# Patient Record
Sex: Female | Born: 1985 | Race: White | Hispanic: No | Marital: Single | Smoking: Never smoker
Health system: Southern US, Community
[De-identification: ages and names within clinical notes are randomized; demographics above are authoritative.]

---
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
## 2005-02-27 ENCOUNTER — Emergency Department: Payer: Self-pay | Admitting: Emergency Medicine

## 2005-03-30 ENCOUNTER — Emergency Department: Payer: Self-pay | Admitting: Emergency Medicine

## 2005-12-07 ENCOUNTER — Emergency Department: Payer: Self-pay | Admitting: Emergency Medicine

## 2005-12-12 ENCOUNTER — Emergency Department: Payer: Self-pay | Admitting: Emergency Medicine

## 2007-05-12 ENCOUNTER — Emergency Department: Payer: Self-pay | Admitting: Unknown Physician Specialty

## 2008-01-31 ENCOUNTER — Emergency Department: Payer: Self-pay | Admitting: Emergency Medicine

## 2008-03-25 ENCOUNTER — Other Ambulatory Visit: Payer: Self-pay

## 2008-03-25 ENCOUNTER — Emergency Department: Payer: Self-pay | Admitting: Emergency Medicine

## 2008-03-28 ENCOUNTER — Emergency Department: Payer: Self-pay | Admitting: Emergency Medicine

## 2008-06-27 ENCOUNTER — Emergency Department: Payer: Self-pay | Admitting: Emergency Medicine

## 2012-01-20 ENCOUNTER — Emergency Department: Payer: Self-pay | Admitting: *Deleted

## 2012-01-20 LAB — URINALYSIS, COMPLETE
Blood: NEGATIVE
Leukocyte Esterase: NEGATIVE
Nitrite: NEGATIVE
Protein: NEGATIVE
RBC,UR: 2 /HPF (ref 0–5)
Specific Gravity: 1.034 (ref 1.003–1.030)
Squamous Epithelial: 8
WBC UR: 2 /HPF (ref 0–5)

## 2012-01-20 LAB — PREGNANCY, URINE: Pregnancy Test, Urine: NEGATIVE m[IU]/mL

## 2012-01-20 LAB — WET PREP, GENITAL

## 2012-03-08 ENCOUNTER — Emergency Department: Payer: Self-pay | Admitting: Emergency Medicine

## 2012-03-08 LAB — COMPREHENSIVE METABOLIC PANEL
Albumin: 3.9 g/dL (ref 3.4–5.0)
Alkaline Phosphatase: 69 U/L (ref 50–136)
Bilirubin,Total: 0.6 mg/dL (ref 0.2–1.0)
Chloride: 109 mmol/L — ABNORMAL HIGH (ref 98–107)
EGFR (African American): >60
Osmolality: 280 (ref 275–301)
SGOT(AST): 25 U/L (ref 15–37)
Total Protein: 7.3 g/dL (ref 6.4–8.2)

## 2012-03-08 LAB — CBC
HGB: 13 g/dL (ref 12.0–16.0)
MCH: 33.5 pg (ref 26.0–34.0)
MCHC: 35.1 g/dL (ref 32.0–36.0)
MCV: 95 fL (ref 80–100)
Platelet: 326 10*3/uL (ref 150–440)
RBC: 3.87 10*6/uL (ref 3.80–5.20)
RDW: 12.3 % (ref 11.5–14.5)

## 2012-03-08 LAB — TROPONIN I: Troponin-I: <0.02 ng/mL

## 2012-05-22 ENCOUNTER — Emergency Department: Payer: Self-pay | Admitting: Emergency Medicine

## 2012-08-14 ENCOUNTER — Emergency Department: Payer: Self-pay | Admitting: Emergency Medicine

## 2012-08-14 LAB — COMPREHENSIVE METABOLIC PANEL
Albumin: 3.5 g/dL (ref 3.4–5.0)
Alkaline Phosphatase: 59 U/L (ref 50–136)
Bilirubin,Total: 0.6 mg/dL (ref 0.2–1.0)
Glucose: 87 mg/dL (ref 65–99)
Osmolality: 275 (ref 275–301)
Potassium: 4.3 mmol/L (ref 3.5–5.1)

## 2012-08-14 LAB — URINALYSIS, COMPLETE
Bacteria: NONE SEEN
Bilirubin,UR: NEGATIVE
Blood: NEGATIVE
Glucose,UR: NEGATIVE mg/dL (ref 0–75)
Ketone: NEGATIVE
Leukocyte Esterase: NEGATIVE
Specific Gravity: 1.018 (ref 1.003–1.030)
Squamous Epithelial: 6

## 2012-08-14 LAB — CBC
HCT: 37.6 % (ref 35.0–47.0)
HGB: 12.8 g/dL (ref 12.0–16.0)
MCH: 32.1 pg (ref 26.0–34.0)
MCHC: 34.1 g/dL (ref 32.0–36.0)
Platelet: 344 10*3/uL (ref 150–440)
RBC: 3.99 10*6/uL (ref 3.80–5.20)
WBC: 5.3 10*3/uL (ref 3.6–11.0)

## 2012-08-14 LAB — WET PREP, GENITAL

## 2012-08-14 LAB — LIPASE, BLOOD: Lipase: 112 U/L (ref 73–393)

## 2012-08-18 ENCOUNTER — Inpatient Hospital Stay: Payer: Self-pay | Admitting: Psychiatry

## 2012-08-18 LAB — COMPREHENSIVE METABOLIC PANEL
Bilirubin,Total: 0.5 mg/dL (ref 0.2–1.0)
Chloride: 106 mmol/L (ref 98–107)
Creatinine: 0.71 mg/dL (ref 0.60–1.30)
EGFR (African American): >60
Glucose: 70 mg/dL (ref 65–99)
Osmolality: 271 (ref 275–301)
Potassium: 4.7 mmol/L (ref 3.5–5.1)
SGOT(AST): 28 U/L (ref 15–37)
Sodium: 137 mmol/L (ref 136–145)

## 2012-08-18 LAB — CBC
HGB: 13.2 g/dL (ref 12.0–16.0)
MCH: 31.8 pg (ref 26.0–34.0)
MCHC: 34.3 g/dL (ref 32.0–36.0)
Platelet: 335 10*3/uL (ref 150–440)
WBC: 6.8 10*3/uL (ref 3.6–11.0)

## 2012-08-18 LAB — ACETAMINOPHEN LEVEL
Acetaminophen: 41 ug/mL — ABNORMAL HIGH
Acetaminophen: 74 ug/mL — ABNORMAL HIGH

## 2012-08-18 LAB — SALICYLATE LEVEL: Salicylates, Serum: <1.7 mg/dL

## 2012-08-18 LAB — DRUG SCREEN, URINE
Amphetamines, Ur Screen: NEGATIVE (ref ?–1000)
Barbiturates, Ur Screen: NEGATIVE (ref ?–200)
Benzodiazepine, Ur Scrn: NEGATIVE (ref ?–200)
Cocaine Metabolite,Ur ~~LOC~~: NEGATIVE (ref ?–300)
MDMA (Ecstasy)Ur Screen: NEGATIVE (ref ?–500)
Opiate, Ur Screen: NEGATIVE (ref ?–300)
Phencyclidine (PCP) Ur S: NEGATIVE (ref ?–25)

## 2012-08-18 LAB — TSH: Thyroid Stimulating Horm: 0.79 u[IU]/mL

## 2012-10-08 ENCOUNTER — Emergency Department: Payer: Self-pay | Admitting: Emergency Medicine

## 2012-10-08 LAB — URINALYSIS, COMPLETE
Bacteria: NONE SEEN
Bilirubin,UR: NEGATIVE
Glucose,UR: NEGATIVE mg/dL (ref 0–75)
Ketone: NEGATIVE
Leukocyte Esterase: NEGATIVE
Ph: 6 (ref 4.5–8.0)
RBC,UR: 8 /HPF (ref 0–5)
Specific Gravity: 1.018 (ref 1.003–1.030)
Squamous Epithelial: 3
WBC UR: 2 /HPF (ref 0–5)

## 2012-10-08 LAB — CBC
HCT: 37.4 % (ref 35.0–47.0)
MCH: 31.4 pg (ref 26.0–34.0)
MCV: 92 fL (ref 80–100)
Platelet: 392 10*3/uL (ref 150–440)
RBC: 4.07 10*6/uL (ref 3.80–5.20)
RDW: 12.6 % (ref 11.5–14.5)

## 2012-10-08 LAB — BASIC METABOLIC PANEL
Anion Gap: 6 — ABNORMAL LOW (ref 7–16)
BUN: 8 mg/dL (ref 7–18)
Chloride: 108 mmol/L — ABNORMAL HIGH (ref 98–107)
Co2: 26 mmol/L (ref 21–32)
EGFR (African American): >60
Glucose: 87 mg/dL (ref 65–99)

## 2012-11-25 ENCOUNTER — Emergency Department: Payer: Self-pay | Admitting: Emergency Medicine

## 2012-11-25 LAB — URINALYSIS, COMPLETE
Bacteria: NONE SEEN
Ketone: NEGATIVE
Leukocyte Esterase: NEGATIVE
Nitrite: NEGATIVE
Ph: 7 (ref 4.5–8.0)
RBC,UR: <1 /HPF (ref 0–5)
Specific Gravity: 1.028 (ref 1.003–1.030)
Squamous Epithelial: 3

## 2013-03-05 ENCOUNTER — Emergency Department: Payer: Self-pay | Admitting: Emergency Medicine

## 2013-03-05 LAB — URINALYSIS, COMPLETE
Bilirubin,UR: NEGATIVE
Blood: NEGATIVE
Glucose,UR: NEGATIVE mg/dL (ref 0–75)
Leukocyte Esterase: NEGATIVE
Nitrite: NEGATIVE
Ph: 8 (ref 4.5–8.0)
Specific Gravity: 1.026 (ref 1.003–1.030)
Squamous Epithelial: 1
WBC UR: 1 /HPF (ref 0–5)

## 2013-03-05 LAB — COMPREHENSIVE METABOLIC PANEL
Albumin: 3.5 g/dL (ref 3.4–5.0)
Anion Gap: 3 — ABNORMAL LOW (ref 7–16)
BUN: 11 mg/dL (ref 7–18)
Bilirubin,Total: 0.4 mg/dL (ref 0.2–1.0)
Calcium, Total: 8.9 mg/dL (ref 8.5–10.1)
Chloride: 109 mmol/L — ABNORMAL HIGH (ref 98–107)
Co2: 28 mmol/L (ref 21–32)
EGFR (Non-African Amer.): >60
Glucose: 85 mg/dL (ref 65–99)
Potassium: 4.1 mmol/L (ref 3.5–5.1)
SGOT(AST): 22 U/L (ref 15–37)
Sodium: 140 mmol/L (ref 136–145)

## 2013-03-05 LAB — CBC
HCT: 37.2 % (ref 35.0–47.0)
HGB: 12.3 g/dL (ref 12.0–16.0)
MCH: 29.4 pg (ref 26.0–34.0)
MCHC: 33.1 g/dL (ref 32.0–36.0)
RBC: 4.18 10*6/uL (ref 3.80–5.20)
WBC: 6.3 10*3/uL (ref 3.6–11.0)

## 2013-03-05 LAB — GC/CHLAMYDIA PROBE AMP

## 2014-12-06 NOTE — Discharge Summary (Signed)
PATIENT NAME:  Cheryl Meyers, Cheryl Meyers MR#:  [id number] DATE OF BIRTH:  June 14, 1986  DATE OF ADMISSION:  08/18/2012 DATE OF DISCHARGE:  08/22/2012  HOSPITAL COURSE:  See dictated history and physical for details of admission.  This 29 year old woman was admitted to the hospital after an impulsive overdose of Tylenol.  In the hospital she has consistently denied suicidal ideation.  She had an improved mood and affect and was cooperative with treatment.  She showed improved insight into her mood and behavior and participated appropriately in groups.  The patient was started on fluoxetine 20 mg daily which she has tolerated well.  The acetaminophen levels were followed and it was determined that she was not in need of acute treatment for acetaminophen toxicity.  The patient was counseled about the risk she had run and the importance of getting involved in treatment for her depression and avoiding alcohol abuse.  At the time of discharge her affect and mood had improved greatly and she was tolerating medication with full agreement to a plan for outpatient treatment.   DISCHARGE MEDICATIONS:  Prozac 20 mg p.o. daily, Vistaril 50 mg p.o. q. 6 hours p.r.n. for anxiety, Desyrel 100 mg p.o. at bedtime p.r.n. for sleep.   LABORATORY RESULTS:  Admission labs showed acetaminophen level at 41.  Chemistry panel without any remarkable abnormalities.  Alcohol undetected.  CBC normal.  Salicylates negative.  TSH normal at 0.79.  Drug screen negative.  Acetaminophen climbed to a high of 74, but then later declined soon afterwards to 9.   MENTAL STATUS EXAM AT DISCHARGE:  Neatly dressed and groomed woman who looks her stated age, cooperative and pleasant in the interview.  Good eye contact, normal psychomotor activity.  Speech normal in rate, tone and volume.  Affect is upbeat and smiling.  Mood is stated as good.  Thoughts are lucid with no loosening of associations or delusional thinking.  Denies hallucinations.  Normal  intelligence evident, good judgment and insight, normal short and long-term memory.   DISPOSITION:  Outpatient follow-up arranged at [facility].   DIAGNOSIS PRINCIPLE AND PRIMARY:  AXIS I:  Depression, not otherwise specified.  SECONDARY DIAGNOSES: AXIS I:  Alcohol abuse.  AXIS II:  Deferred.  AXIS III:  Status post acetaminophen overdose.  AXIS IV:  Moderate stress from difficulty in her relationship.  AXIS V:  Functioning at time of discharge 60.       ____________________________ Audery AmelJohn T. Clapacs, MD jtc:ea D: 08/22/2012 21:46:09 ET T: 08/22/2012 23:17:31 ET JOB#: [id number]  cc: Audery AmelJohn T. Clapacs, MD, <Dictator> Audery AmelJOHN T CLAPACS MD ELECTRONICALLY SIGNED 08/23/2012 11:47
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
## 2015-08-08 ENCOUNTER — Emergency Department
Admission: EM | Admit: 2015-08-08 | Discharge: 2015-08-08 | Disposition: A | Discharge status: Home / Self Care | Payer: BLUE CROSS/BLUE SHIELD | Attending: Emergency Medicine | Admitting: Emergency Medicine

## 2015-08-08 ENCOUNTER — Encounter: Payer: Self-pay | Admitting: Emergency Medicine

## 2015-08-08 ENCOUNTER — Emergency Department: Payer: BLUE CROSS/BLUE SHIELD

## 2015-08-08 DIAGNOSIS — S39012A Strain of muscle, fascia and tendon of lower back, initial encounter: Secondary | ICD-10-CM | POA: Insufficient documentation

## 2015-08-08 DIAGNOSIS — Y92410 Unspecified street and highway as the place of occurrence of the external cause: Secondary | ICD-10-CM | POA: Insufficient documentation

## 2015-08-08 DIAGNOSIS — S161XXA Strain of muscle, fascia and tendon at neck level, initial encounter: Secondary | ICD-10-CM

## 2015-08-08 DIAGNOSIS — Y998 Other external cause status: Secondary | ICD-10-CM | POA: Diagnosis not present

## 2015-08-08 DIAGNOSIS — S199XXA Unspecified injury of neck, initial encounter: Secondary | ICD-10-CM | POA: Diagnosis present

## 2015-08-08 DIAGNOSIS — Y9389 Activity, other specified: Secondary | ICD-10-CM | POA: Diagnosis not present

## 2015-08-08 MED ORDER — CYCLOBENZAPRINE HCL 10 MG PO TABS: 10.0000 mg | ORAL_TABLET | Freq: Three times a day (TID) | ORAL | Status: AC | PRN
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
## 2015-12-08 ENCOUNTER — Emergency Department (HOSPITAL_COMMUNITY)
Admission: EM | Admit: 2015-12-08 | Discharge: 2015-12-08 | Disposition: A | Discharge status: Home / Self Care | Payer: BLUE CROSS/BLUE SHIELD | Attending: Emergency Medicine | Admitting: Emergency Medicine

## 2015-12-08 ENCOUNTER — Encounter (HOSPITAL_COMMUNITY): Payer: Self-pay | Admitting: *Deleted

## 2015-12-08 DIAGNOSIS — J069 Acute upper respiratory infection, unspecified: Secondary | ICD-10-CM | POA: Diagnosis not present

## 2015-12-08 DIAGNOSIS — R1110 Vomiting, unspecified: Secondary | ICD-10-CM | POA: Diagnosis not present

## 2015-12-08 DIAGNOSIS — R05 Cough: Secondary | ICD-10-CM | POA: Diagnosis present

## 2015-12-08 LAB — RAPID STREP SCREEN (MED CTR MEBANE ONLY): Streptococcus, Group A Screen (Direct): NEGATIVE

## 2015-12-08 MED ORDER — DEXAMETHASONE SODIUM PHOSPHATE 10 MG/ML IJ SOLN
10.0000 mg | Freq: Once | INTRAMUSCULAR | Status: AC
  Administered 2015-12-08: 10 mg via INTRAMUSCULAR
  Filled 2015-12-08: qty 1

## 2015-12-08 MED ORDER — OSELTAMIVIR PHOSPHATE 75 MG PO CAPS: 75.0000 mg | ORAL_CAPSULE | Freq: Two times a day (BID) | ORAL | Status: AC

## 2015-12-08 MED ORDER — ONDANSETRON HCL 4 MG PO TABS: 4.0000 mg | ORAL_TABLET | Freq: Four times a day (QID) | ORAL | Status: DC

## 2015-12-08 NOTE — Discharge Instructions (Signed)
Ms. Cheryl Meyers,  Nice meeting you! Please follow-up with your primary care provider. Return to the emergency department if you develop chest pain, shortness of breath, are unable to keep foods down, new/worsening symptoms. Feel better soon!  S. Lane HackerNicole Gwynne Kemnitz, PA-C Upper Respiratory Infection, Adult Most upper respiratory infections (URIs) are a viral infection of the air passages leading to the lungs. A URI affects the nose, throat, and upper air passages. The most common type of URI is nasopharyngitis and is typically referred to as "the common cold." URIs run their course and usually go away on their own. Most of the time, a URI does not require medical attention, but sometimes a bacterial infection in the upper airways can follow a viral infection. This is called a secondary infection. Sinus and middle ear infections are common types of secondary upper respiratory infections. Bacterial pneumonia can also complicate a URI. A URI can worsen asthma and chronic obstructive pulmonary disease (COPD). Sometimes, these complications can require emergency medical care and may be life threatening.  CAUSES Almost all URIs are caused by viruses. A virus is a type of germ and can spread from one person to another.  RISKS FACTORS You may be at risk for a URI if:   You smoke.   You have chronic heart or lung disease.  You have a weakened defense (immune) system.   You are very young or very old.   You have nasal allergies or asthma.  You work in crowded or poorly ventilated areas.  You work in health care facilities or schools. SIGNS AND SYMPTOMS  Symptoms typically develop 2-3 days after you come in contact with a cold virus. Most viral URIs last 7-10 days. However, viral URIs from the influenza virus (flu virus) can last 14-18 days and are typically more severe. Symptoms may include:   Runny or stuffy (congested) nose.   Sneezing.   Cough.   Sore throat.   Headache.    Fatigue.   Fever.   Loss of appetite.   Pain in your forehead, behind your eyes, and over your cheekbones (sinus pain).  Muscle aches.  DIAGNOSIS  Your health care provider may diagnose a URI by:  Physical exam.  Tests to check that your symptoms are not due to another condition such as:  Strep throat.  Sinusitis.  Pneumonia.  Asthma. TREATMENT  A URI goes away on its own with time. It cannot be cured with medicines, but medicines may be prescribed or recommended to relieve symptoms. Medicines may help:  Reduce your fever.  Reduce your cough.  Relieve nasal congestion. HOME CARE INSTRUCTIONS   Take medicines only as directed by your health care provider.   Gargle warm saltwater or take cough drops to comfort your throat as directed by your health care provider.  Use a warm mist humidifier or inhale steam from a shower to increase air moisture. This may make it easier to breathe.  Drink enough fluid to keep your urine clear or pale yellow.   Eat soups and other clear broths and maintain good nutrition.   Rest as needed.   Return to work when your temperature has returned to normal or as your health care provider advises. You may need to stay home longer to avoid infecting others. You can also use a face mask and careful hand washing to prevent spread of the virus.  Increase the usage of your inhaler if you have asthma.   Do not use any tobacco products, including cigarettes,  chewing tobacco, or electronic cigarettes. If you need help quitting, ask your health care provider. PREVENTION  The best way to protect yourself from getting a cold is to practice good hygiene.   Avoid oral or hand contact with people with cold symptoms.   Wash your hands often if contact occurs.  There is no clear evidence that vitamin C, vitamin E, echinacea, or exercise reduces the chance of developing a cold. However, it is always recommended to get plenty of rest,  exercise, and practice good nutrition.  SEEK MEDICAL CARE IF:   You are getting worse rather than better.   Your symptoms are not controlled by medicine.   You have chills.  You have worsening shortness of breath.  You have brown or red mucus.  You have yellow or brown nasal discharge.  You have pain in your face, especially when you bend forward.  You have a fever.  You have swollen neck glands.  You have pain while swallowing.  You have white areas in the back of your throat. SEEK IMMEDIATE MEDICAL CARE IF:   You have severe or persistent:  Headache.  Ear pain.  Sinus pain.  Chest pain.  You have chronic lung disease and any of the following:  Wheezing.  Prolonged cough.  Coughing up blood.  A change in your usual mucus.  You have a stiff neck.  You have changes in your:  Vision.  Hearing.  Thinking.  Mood. MAKE SURE YOU:   Understand these instructions.  Will watch your condition.  Will get help right away if you are not doing well or get worse.   This information is not intended to replace advice given to you by your health care provider. Make sure you discuss any questions you have with your health care provider.   Document Released: 01/26/2001 Document Revised: 12/17/2014 Document Reviewed: 11/07/2013 Elsevier Interactive Patient Education [employer].

## 2015-12-08 NOTE — ED Notes (Signed)
Cold cough that started yesterday  Vomits with coughing and her throat feels sore  lmp now

## 2015-12-08 NOTE — ED Provider Notes (Signed)
CSN: [id number]     Arrival date & time 12/08/15  2044 History  By signing my name below, I, Cheryl Meyers, attest that this documentation has been prepared under the direction and in the presence of S. Lane HackerNicole Stan Cantave, PA-C. Electronically Signed: Ronney LionSuzanne Meyers, ED Scribe. 12/08/2015. 11:30 PM.    Chief Complaint  Patient presents with  . Cough    The history is provided by the patient. No language interpreter was used.    HPI Comments: Cheryl PilonCatherine Meyers is a 30 y.o. female who presents to the Emergency Department complaining of a gradual-onset, constant, moderate, worsening cough that began last night. She complains of associated post-tussive vomiting and sore throat. No treatments or modifying factors were noted. She denies any known fever.  History reviewed. No pertinent past medical history. History reviewed. No pertinent past surgical history. No family history on file. Social History  Substance Use Topics  . Smoking status: Never Smoker   . Smokeless tobacco: None  . Alcohol Use: No   OB History    No data available     Review of Systems  Constitutional: Negative for fever.  HENT: Positive for sore throat.   Respiratory: Positive for cough.   Gastrointestinal: Positive for vomiting (post-tussive).      Allergies  Aspirin and Sulfa antibiotics  Home Medications   Prior to Admission medications   Medication Sig Start Date End Date Taking? Authorizing Provider  cyclobenzaprine (FLEXERIL) 10 MG tablet Take 1 tablet (10 mg total) by mouth every 8 (eight) hours as needed for muscle spasms. 08/08/15   Charmayne Sheerharles M Beers, PA-C   BP 127/94 mmHg  Pulse 101  Temp(Src) 98.6 F (37 C) (Oral)  Resp 16  Ht 5\' 5"  (1.651 m)  Wt 208 lb 1 oz (94.377 kg)  BMI 34.62 kg/m2  SpO2 98%  LMP 12/08/2015 Physical Exam  Constitutional: She is oriented to person, place, and time. She appears well-developed and well-nourished. No distress.  HENT:  Head: Normocephalic and atraumatic.   Mouth/Throat: Uvula is midline. No trismus in the jaw. Posterior oropharyngeal erythema present.  Oropharyngeal erythema. No tonsillar swelling or exudate. Normal TM's bilaterally. No uvular deviation. No trismus.   Eyes: Conjunctivae and EOM are normal.  Neck: Neck supple. No tracheal deviation present.  Cardiovascular: Normal rate, regular rhythm and normal heart sounds.  Exam reveals no gallop and no friction rub.   No murmur heard. Pulmonary/Chest: Effort normal and breath sounds normal. No respiratory distress. She has no wheezes. She has no rales. She exhibits no tenderness.  Abdominal: Soft. There is no tenderness.  No abdominal tenderness.  Musculoskeletal: Normal range of motion.  Neurological: She is alert and oriented to person, place, and time.  Skin: Skin is warm and dry.  Psychiatric: She has a normal mood and affect. Her behavior is normal.  Nursing note and vitals reviewed.   ED Course  Procedures (including critical care time)  DIAGNOSTIC STUDIES: Oxygen Saturation is 98% on RA, normal by my interpretation.    COORDINATION OF CARE: 9:37 PM - Discussed treatment plan with pt at bedside which includes rapid strep screen and pain-relieving medications. Pt verbalized understanding and agreed to plan.   Labs Review Labs Reviewed  RAPID STREP [facility]CREEN (NOT [facility])  CULTURE, GROUP A STREP [facility])   MDM   Final diagnoses:  None   Pt rapid strep screen negative. Patients symptoms are consistent with URI, likely viral etiology. Discussed that antibiotics are not indicated for viral infections. Pt will be  discharged with symptomatic treatment, including Zofran and Tamiflu, given that patient's symptoms fall within time window. Single dosage of Decadron administered here. Pt verbalizes understanding and is agreeable with plan. Pt is hemodynamically stable & in NAD prior to dc.   I personally performed the services described in this documentation, which was scribed in my  presence. The recorded information has been reviewed and is accurate.     Melton Krebs, PA-C 12/12/15 1610  Glynn Octave, MD 12/12/15 [phone]

## 2015-12-11 LAB — CULTURE, GROUP A STREP (THRC)

## 2016-01-27 ENCOUNTER — Emergency Department
Admission: EM | Admit: 2016-01-27 | Discharge: 2016-01-27 | Disposition: A | Discharge status: Home / Self Care | Payer: BLUE CROSS/BLUE SHIELD | Attending: Emergency Medicine | Admitting: Emergency Medicine

## 2016-01-27 ENCOUNTER — Encounter: Payer: Self-pay | Admitting: Emergency Medicine

## 2016-01-27 ENCOUNTER — Emergency Department: Payer: BLUE CROSS/BLUE SHIELD

## 2016-01-27 DIAGNOSIS — N939 Abnormal uterine and vaginal bleeding, unspecified: Secondary | ICD-10-CM | POA: Insufficient documentation

## 2016-01-27 DIAGNOSIS — R55 Syncope and collapse: Secondary | ICD-10-CM

## 2016-01-27 DIAGNOSIS — R102 Pelvic and perineal pain: Secondary | ICD-10-CM

## 2016-01-27 DIAGNOSIS — R112 Nausea with vomiting, unspecified: Secondary | ICD-10-CM

## 2016-01-27 DIAGNOSIS — Z79899 Other long term (current) drug therapy: Secondary | ICD-10-CM | POA: Diagnosis not present

## 2016-01-27 LAB — COMPREHENSIVE METABOLIC PANEL
ALK PHOS: 60 U/L (ref 38–126)
ALT: 20 U/L (ref 14–54)
AST: 22 U/L (ref 15–41)
Albumin: 3.7 g/dL (ref 3.5–5.0)
Anion gap: 6 (ref 5–15)
BUN: 9 mg/dL (ref 6–20)
CALCIUM: 8.9 mg/dL (ref 8.9–10.3)
CHLORIDE: 107 mmol/L (ref 101–111)
CO2: 24 mmol/L (ref 22–32)
CREATININE: 0.96 mg/dL (ref 0.44–1.00)
Glucose, Bld: 80 mg/dL (ref 65–99)
Potassium: 3.7 mmol/L (ref 3.5–5.1)
Sodium: 137 mmol/L (ref 135–145)
Total Bilirubin: 0.6 mg/dL (ref 0.3–1.2)
Total Protein: 6.8 g/dL (ref 6.5–8.1)

## 2016-01-27 LAB — URINALYSIS COMPLETE WITH MICROSCOPIC (ARMC ONLY)
BILIRUBIN URINE: NEGATIVE
GLUCOSE, UA: NEGATIVE mg/dL
Hgb urine dipstick: NEGATIVE
KETONES UR: NEGATIVE mg/dL
LEUKOCYTES UA: NEGATIVE
Nitrite: NEGATIVE
Protein, ur: 30 mg/dL — AB
Specific Gravity, Urine: 1.025 (ref 1.005–1.030)
pH: 8 (ref 5.0–8.0)

## 2016-01-27 LAB — CBC
HCT: 35.7 % (ref 35.0–47.0)
Hemoglobin: 12.5 g/dL (ref 12.0–16.0)
MCH: 30.7 pg (ref 26.0–34.0)
MCHC: 34.8 g/dL (ref 32.0–36.0)
MCV: 88.2 fL (ref 80.0–100.0)
PLATELETS: 312 10*3/uL (ref 150–440)
RBC: 4.05 MIL/uL (ref 3.80–5.20)
RDW: 13.9 % (ref 11.5–14.5)
WBC: 5.9 10*3/uL (ref 3.6–11.0)

## 2016-01-27 LAB — LIPASE, BLOOD: LIPASE: 36 U/L (ref 11–51)

## 2016-01-27 LAB — WET PREP, GENITAL
Clue Cells Wet Prep HPF POC: NONE SEEN
SPERM: NONE SEEN
TRICH WET PREP: NONE SEEN
YEAST WET PREP: NONE SEEN

## 2016-01-27 LAB — CHLAMYDIA/NGC RT PCR (ARMC ONLY)
Chlamydia Tr: NOT DETECTED
N gonorrhoeae: DETECTED — AB

## 2016-01-27 LAB — POCT PREGNANCY, URINE: Preg Test, Ur: NEGATIVE

## 2016-01-27 MED ORDER — SODIUM CHLORIDE 0.9 % IV BOLUS (SEPSIS)
1000.0000 mL | Freq: Once | INTRAVENOUS | Status: AC
  Administered 2016-01-27: 1000 mL via INTRAVENOUS

## 2016-01-27 MED ORDER — IOPAMIDOL (ISOVUE-300) INJECTION 61%
100.0000 mL | Freq: Once | INTRAVENOUS | Status: AC | PRN
  Administered 2016-01-27: 100 mL via INTRAVENOUS
  Filled 2016-01-27: qty 100

## 2016-01-27 MED ORDER — ONDANSETRON 4 MG PO TBDP
ORAL_TABLET | ORAL | Status: AC
  Filled 2016-01-27: qty 1

## 2016-01-27 MED ORDER — ONDANSETRON HCL 4 MG PO TABS
4.0000 mg | ORAL_TABLET | Freq: Once | ORAL | Status: AC
  Administered 2016-01-27: 4 mg via ORAL
  Filled 2016-01-27: qty 1

## 2016-01-27 MED ORDER — DIATRIZOATE MEGLUMINE & SODIUM 66-10 % PO SOLN
15.0000 mL | Freq: Once | ORAL | Status: AC
  Administered 2016-01-27: 15 mL via ORAL

## 2016-01-27 MED ORDER — ONDANSETRON HCL 4 MG PO TABS: 4.0000 mg | ORAL_TABLET | Freq: Every day | ORAL | Status: AC | PRN

## 2016-01-27 NOTE — ED Notes (Signed)
Pt reports lower abdominal pain x4 days; reports nausea, denies vomiting or diarrhea.

## 2016-01-27 NOTE — ED Notes (Addendum)
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

## 2016-01-27 NOTE — ED Notes (Signed)
Pt informed to return if any life threatening symptoms occur.  

## 2016-01-27 NOTE — ED Provider Notes (Signed)
[facility] Emergency Department Provider Note   ____________________________________________  Time seen: Approximately 4:30 PM  I have reviewed the triage vital signs and the nursing notes.   HISTORY  Chief Complaint Abdominal Pain   HPI Cheryl Meyers is a 30 y.o. female who is presenting to the emergency today with 4 days of nausea as well as lower abdominal cramping and vaginal discharge. She said that she was at work in the heat this morning for about 15 minutes when she began to feel lightheaded. She felt as if she would pass out. She was then brought back and side at which point the lightheadedness subsided. She denies any chest pain or shortness of breath. Says that over the past 4 days she has had lower abdominal pain which is cramping. Says that it is a 5 out of 10 right now. Says that it radiates occasionally up her back. Says that she also has a light brown discharge that just started today. She also vomited her lunch today. Otherwise she says she has had no vomiting but has had nausea. No diarrhea. Denies any sick contacts.   History reviewed. No pertinent past medical history.  There are no active problems to display for this patient.   No past surgical history on file.  Current Outpatient Rx  Name  Route  Sig  Dispense  Refill  . cyclobenzaprine (FLEXERIL) 10 MG tablet   Oral   Take 1 tablet (10 mg total) by mouth every 8 (eight) hours as needed for muscle spasms. Patient not taking: Reported on 12/08/2015   30 tablet   1   . ondansetron (ZOFRAN) 4 MG tablet   Oral   Take 1 tablet (4 mg total) by mouth every 6 (six) hours.   12 tablet   0   . oseltamivir (TAMIFLU) 75 MG capsule   Oral   Take 1 capsule (75 mg total) by mouth every 12 (twelve) hours.   10 capsule   0     Allergies Aspirin and Sulfa antibiotics  No family history on file.  Social History Social History  Substance Use Topics  . Smoking status: Never  Smoker   . Smokeless tobacco: None  . Alcohol Use: No    Review of Systems Constitutional: No fever/chills Eyes: No visual changes. ENT: No sore throat. Cardiovascular: Denies chest pain. Respiratory: Denies shortness of breath. Gastrointestinal:  No diarrhea.  No constipation. Genitourinary: Negative for dysuria. Musculoskeletal: As above Skin: Negative for rash. Neurological: Negative for headaches, focal weakness or numbness.  10-point ROS otherwise negative.  ____________________________________________   PHYSICAL EXAM:  VITAL SIGNS: ED Triage Vitals  Enc Vitals Group     BP 01/27/16 1532 126/86 mmHg     Pulse Rate 01/27/16 1532 79     Resp 01/27/16 1532 16     Temp 01/27/16 1532 98.8 F (37.1 C)     Temp Source 01/27/16 1532 Oral     SpO2 01/27/16 1532 100 %     Weight 01/27/16 1532 206 lb (93.441 kg)     Height 01/27/16 1532 5\' 3"  (1.6 m)     Head Cir --      Peak Flow --      Pain Score 01/27/16 1532 5     Pain Loc --      Pain Edu? --      Excl. in GC? --     Constitutional: Alert and oriented. Well appearing and in no acute distress. Eyes: Conjunctivae are  normal. PERRL. EOMI. Head: Atraumatic. Nose: No congestion/rhinnorhea. Mouth/Throat: Mucous membranes are moist.  Oropharynx non-erythematous. Neck: No stridor.   Cardiovascular: Normal rate, regular rhythm. Grossly normal heart sounds.  Good peripheral circulation. Respiratory: Normal respiratory effort.  No retractions. Lungs CTAB. Gastrointestinal: Soft With mild tenderness to palpation across the lower abdomen. No rebound or guarding. No distention.  No CVA tenderness. Genitourinary: Normal external exam. Speculum exam with small amount of brown blood in the vault. No active bleeding from the cervical os. By manual exam without cervical motion tenderness. No uterine tenderness or left adnexal tenderness both right adnexal tenderness. Musculoskeletal: No lower extremity tenderness nor edema.  No  joint effusions. Neurologic:  Normal speech and language. No gross focal neurologic deficits are appreciated. No gait instability. Skin:  Skin is warm, dry and intact. No rash noted. Psychiatric: Mood and affect are normal. Speech and behavior are normal.  ____________________________________________   LABS (all labs ordered are listed, but only abnormal results are displayed)  Labs Reviewed  WET PREP, GENITAL - Abnormal; Notable for the following:    WBC, Wet Prep HPF POC RARE (*)    All other components within normal limits  URINALYSIS COMPLETEWITH MICROSCOPIC ([facility] ONLY) - Abnormal; Notable for the following:    Color, Urine YELLOW (*)    APPearance CLEAR (*)    Protein, ur 30 (*)    Bacteria, UA RARE (*)    Squamous Epithelial / LPF 0-5 (*)    All other components within normal limits  CHLAMYDIA/NGC RT PCR ([facility] ONLY)  LIPASE, BLOOD  COMPREHENSIVE METABOLIC PANEL  CBC  POC URINE PREG, ED  POCT PREGNANCY, URINE   ____________________________________________  EKG  ED ECG REPORT I, Schaevitz,  Teena Irani, the attending physician, personally viewed and interpreted this ECG.   Date: 01/27/2016  EKG Time: 1651  Rate: 82  Rhythm: normal sinus rhythm  Axis: Normal  Intervals:none  ST&T Change: No ST segment elevation or depression. T-wave inversion in V2. Similar appearance to EKG from 10/08/2012.  ____________________________________________  RADIOLOGY  [place] Art/Ven Flow Abd Pelv Doppler (Final result) Result time: 01/27/16 17:30:46   Final result by Rad Results In Interface (01/27/16 17:30:46)   Narrative:   CLINICAL DATA: Lower abdominal and pelvic pain for 4 days with nausea.  EXAM: TRANSABDOMINAL AND TRANSVAGINAL ULTRASOUND OF PELVIS  DOPPLER ULTRASOUND OF OVARIES  TECHNIQUE: Both transabdominal and transvaginal ultrasound examinations of the pelvis were performed. Transabdominal technique was performed for global imaging of the pelvis including  uterus, ovaries, adnexal regions, and pelvic cul-de-sac.  It was necessary to proceed with endovaginal exam following the transabdominal exam to visualize the endometrium and ovaries. Color and duplex Doppler ultrasound was utilized to evaluate blood flow to the ovaries.  COMPARISON: None.  FINDINGS: Uterus  Measurements: 7.2 x 4.0 x 4.2 cm. No fibroids or other mass visualized. Nabothian cysts are incidentally noted.  Endometrium  Thickness: 0.5 cm. No focal abnormality visualized.  Right ovary  Measurements: 2.7 x 1.4 x 2.3 cm. Normal appearance/no adnexal mass.  Left ovary  Measurements: 2.7 x 3.3 x 3.0 cm. Normal appearance/no adnexal mass.  Pulsed Doppler evaluation of both ovaries demonstrates normal low-resistance arterial and venous waveforms.  Other findings  Small volume of free pelvic fluid is noted.  IMPRESSION: Negative exam.   Electronically Signed By: Drusilla Kanner M.D. On: 01/27/2016 17:30       ____________________________________________   PROCEDURES   ____________________________________________   INITIAL IMPRESSION / ASSESSMENT AND PLAN / ED COURSE  Pertinent labs & imaging results that were available during my care of the patient were reviewed by me and considered in my medical decision making (see chart for details).  Offered pain meds the patient says that she would just prefer nausea meds at this time.  ----------------------------------------- 5:39 PM on 01/27/2016 -----------------------------------------  Patient with right adnexal tenderness on the pelvic exam but with the normal exam on the ultrasound of the pelvis. Also with right lower quadrant abdominal pain now without any left lower quadrant abdominal pain. We'll CAT scan the belly of the pain appears to be migrating to the right lower quadrant. Concern for appendicitis.  ----------------------------------------- 6:42 PM on  01/27/2016 -----------------------------------------  Patient resting comfortably at this time. Able to tolerate by mouth contrast. No acute pathology on the ultrasound of the pelvis or CAT scan of the abdomen. No acute appendicitis. Symptoms likely related to the patient's discontinuation of Depo-Provera and subsequent uterine cramping. I advised her to make sure to drink plenty of fluids and sit down which can avoid the heat whenever possible. ____________________________________________   FINAL CLINICAL IMPRESSION(S) / ED DIAGNOSES  Final diagnoses:  Pelvic pain in female  Pelvic pain in female  Near syncope. Uterine bleeding.   NEW MEDICATIONS STARTED DURING THIS VISIT:  New Prescriptions   No medications on file     Note:  This document was prepared using Dragon voice recognition software and may include unintentional dictation errors.    Myrna Blazeravid Matthew Schaevitz, MD 01/27/16 [phone]

## 2016-01-28 ENCOUNTER — Telehealth: Payer: Self-pay | Admitting: Emergency Medicine

## 2016-01-28 NOTE — ED Notes (Signed)
Patient has gonorrhea and needs treatment.   [place] STD clinic number:  [phone].

## 2016-02-01 ENCOUNTER — Telehealth: Payer: Self-pay | Admitting: Emergency Medicine

## 2016-02-01 ENCOUNTER — Emergency Department (HOSPITAL_COMMUNITY)
Admission: EM | Admit: 2016-02-01 | Discharge: 2016-02-01 | Disposition: A | Discharge status: Home / Self Care | Payer: BLUE CROSS/BLUE SHIELD | Attending: Emergency Medicine | Admitting: Emergency Medicine

## 2016-02-01 ENCOUNTER — Encounter (HOSPITAL_COMMUNITY): Payer: Self-pay

## 2016-02-01 DIAGNOSIS — N730 Acute parametritis and pelvic cellulitis: Secondary | ICD-10-CM | POA: Diagnosis not present

## 2016-02-01 DIAGNOSIS — Z79899 Other long term (current) drug therapy: Secondary | ICD-10-CM | POA: Insufficient documentation

## 2016-02-01 DIAGNOSIS — N898 Other specified noninflammatory disorders of vagina: Secondary | ICD-10-CM | POA: Diagnosis present

## 2016-02-01 LAB — COMPREHENSIVE METABOLIC PANEL
ALK PHOS: 56 U/L (ref 38–126)
ALT: 22 U/L (ref 14–54)
AST: 23 U/L (ref 15–41)
Albumin: 3.6 g/dL (ref 3.5–5.0)
Anion gap: 5 (ref 5–15)
BUN: 8 mg/dL (ref 6–20)
CALCIUM: 9.1 mg/dL (ref 8.9–10.3)
CO2: 28 mmol/L (ref 22–32)
CREATININE: 0.95 mg/dL (ref 0.44–1.00)
Chloride: 105 mmol/L (ref 101–111)
GFR calc non Af Amer: >60 mL/min (ref >60)
GLUCOSE: 100 mg/dL — AB (ref 65–99)
Potassium: 4.1 mmol/L (ref 3.5–5.1)
SODIUM: 138 mmol/L (ref 135–145)
TOTAL PROTEIN: 7 g/dL (ref 6.5–8.1)
Total Bilirubin: 0.7 mg/dL (ref 0.3–1.2)

## 2016-02-01 LAB — CBC
HEMATOCRIT: 36.5 % (ref 36.0–46.0)
Hemoglobin: 12.3 g/dL (ref 12.0–15.0)
MCH: 29.6 pg (ref 26.0–34.0)
MCHC: 33.7 g/dL (ref 30.0–36.0)
MCV: 87.7 fL (ref 78.0–100.0)
Platelets: 327 10*3/uL (ref 150–400)
RBC: 4.16 MIL/uL (ref 3.87–5.11)
RDW: 13 % (ref 11.5–15.5)
WBC: 6.6 10*3/uL (ref 4.0–10.5)

## 2016-02-01 LAB — URINALYSIS, ROUTINE W REFLEX MICROSCOPIC
BILIRUBIN URINE: NEGATIVE
Glucose, UA: NEGATIVE mg/dL
KETONES UR: NEGATIVE mg/dL
Leukocytes, UA: NEGATIVE
NITRITE: NEGATIVE
PH: 6 (ref 5.0–8.0)
Protein, ur: NEGATIVE mg/dL
Specific Gravity, Urine: 1.034 — ABNORMAL HIGH (ref 1.005–1.030)

## 2016-02-01 LAB — URINE MICROSCOPIC-ADD ON

## 2016-02-01 LAB — POC URINE PREG, ED: Preg Test, Ur: NEGATIVE

## 2016-02-01 LAB — LIPASE, BLOOD: Lipase: 31 U/L (ref 11–51)

## 2016-02-01 MED ORDER — DOXYCYCLINE HYCLATE 100 MG PO CAPS: 100.0000 mg | ORAL_CAPSULE | Freq: Two times a day (BID) | ORAL | Status: AC

## 2016-02-01 MED ORDER — IBUPROFEN 600 MG PO TABS: 600.0000 mg | ORAL_TABLET | Freq: Four times a day (QID) | ORAL | Status: AC | PRN

## 2016-02-01 MED ORDER — KETOROLAC TROMETHAMINE 60 MG/2ML IM SOLN
60.0000 mg | Freq: Once | INTRAMUSCULAR | Status: AC
  Administered 2016-02-01: 60 mg via INTRAMUSCULAR
  Filled 2016-02-01: qty 2

## 2016-02-01 MED ORDER — OXYCODONE-ACETAMINOPHEN 5-325 MG PO TABS: 1.0000 | ORAL_TABLET | Freq: Four times a day (QID) | ORAL | Status: AC | PRN

## 2016-02-01 MED ORDER — CEFTRIAXONE SODIUM 250 MG IJ SOLR
250.0000 mg | Freq: Once | INTRAMUSCULAR | Status: AC
  Administered 2016-02-01: 250 mg via INTRAMUSCULAR
  Filled 2016-02-01: qty 250

## 2016-02-01 MED ORDER — LIDOCAINE HCL (PF) 1 % IJ SOLN
INTRAMUSCULAR | Status: AC
  Administered 2016-02-01: 0.9 mL
  Filled 2016-02-01: qty 5

## 2016-02-01 MED ORDER — DOXYCYCLINE HYCLATE 100 MG PO TABS
100.0000 mg | ORAL_TABLET | Freq: Once | ORAL | Status: AC
  Administered 2016-02-01: 100 mg via ORAL
  Filled 2016-02-01: qty 1

## 2016-02-01 NOTE — Discharge Instructions (Signed)

## 2016-02-01 NOTE — ED Notes (Signed)
Pt states informed by PCP that she has gonorrhea. Told to seek treatment. Pt complaining of lower abdominal pain. Pt states yellowish vaginal discharge and burning/itching with urination.

## 2016-02-01 NOTE — ED Provider Notes (Signed)
CSN: [id number]     Arrival date & time 02/01/16  2041 History   First MD Initiated Contact with Patient 02/01/16 2218     Chief Complaint  Patient presents with  . Vaginal Discharge     (Consider location/radiation/quality/duration/timing/severity/associated sxs/prior Treatment) HPI Comments: 30 year old female with no significant past medical history presents to the emergency department for persistent suprapubic and pelvic cramping pain with associated vaginal discharge. Patient was seen for similar symptoms at [facility] on 01/27/2016. She was notified recently of a positive gonorrhea test. She was told to seek treatment at the health department, but did not believe that she could wait that long to be seen. She states that she has notified her sexual partners of  their need to be tested and treated for STDs. She has had no fever or vomiting. No history of abdominal surgeries. She has taken Aleve today with mild to moderate relief of her pain.  Patient is a 30 y.o. female presenting with vaginal discharge. The history is provided by the patient. No language interpreter was used.  Vaginal Discharge Associated symptoms: abdominal pain   Associated symptoms: no fever and no vomiting     History reviewed. No pertinent past medical history. History reviewed. No pertinent past surgical history. History reviewed. No pertinent family history. Social History  Substance Use Topics  . Smoking status: Never Smoker   . Smokeless tobacco: None  . Alcohol Use: No   OB History    No data available      Review of Systems  Constitutional: Negative for fever.  Gastrointestinal: Positive for abdominal pain. Negative for vomiting.  Genitourinary: Positive for vaginal discharge and pelvic pain.  All other systems reviewed and are negative.   Allergies  Aspirin and Sulfa antibiotics  Home Medications   Prior to Admission medications   Medication Sig Start Date End Date  Taking? Authorizing Provider  cyclobenzaprine (FLEXERIL) 10 MG tablet Take 1 tablet (10 mg total) by mouth every 8 (eight) hours as needed for muscle spasms. Patient not taking: Reported on 12/08/2015 08/08/15   Charmayne Sheer Beers, PA-C  doxycycline (VIBRAMYCIN) 100 MG capsule Take 1 capsule (100 mg total) by mouth 2 (two) times daily. Take with full glass of milk or water 02/01/16   Antony Madura, PA-C  ibuprofen (ADVIL,MOTRIN) 600 MG tablet Take 1 tablet (600 mg total) by mouth every 6 (six) hours as needed for mild pain, moderate pain or cramping. 02/01/16   Antony Madura, PA-C  ondansetron (ZOFRAN) 4 MG tablet Take 1 tablet (4 mg total) by mouth daily as needed. 01/27/16   Myrna Blazer, MD  oseltamivir (TAMIFLU) 75 MG capsule Take 1 capsule (75 mg total) by mouth every 12 (twelve) hours. 12/08/15   Melton Krebs, PA-C  oxyCODONE-acetaminophen (PERCOCET/ROXICET) 5-325 MG tablet Take 1-2 tablets by mouth every 6 (six) hours as needed for severe pain. 02/01/16   Antony Madura, PA-C   BP 136/92 mmHg  Pulse 81  Temp(Src) 98.4 F (36.9 C) (Oral)  Resp 18  SpO2 100%  LMP 01/15/2016 (Exact Date)   Physical Exam  Constitutional: She is oriented to person, place, and time. She appears well-developed and well-nourished. No distress.  Nontoxic appearing  HENT:  Head: Normocephalic and atraumatic.  Eyes: Conjunctivae and EOM are normal. No scleral icterus.  Neck: Normal range of motion.  Pulmonary/Chest: Effort normal. No respiratory distress.  Respirations even and unlabored  Abdominal: Soft. She exhibits no distension. There is tenderness. There is no  rebound and no guarding.  Mild suprapubic tenderness. No masses. No peritoneal signs.  Musculoskeletal: Normal range of motion.  Neurological: She is alert and oriented to person, place, and time. She exhibits normal muscle tone. Coordination normal.  Skin: Skin is warm and dry. No rash noted. She is not diaphoretic. No erythema. No pallor.   Psychiatric: She has a normal mood and affect. Her behavior is normal.  Nursing note and vitals reviewed.   ED Course  Procedures (including critical care time) Labs Review Labs Reviewed  COMPREHENSIVE METABOLIC PANEL - Abnormal; Notable for the following:    Glucose, Bld 100 (*)    All other components within normal limits  URINALYSIS, ROUTINE W REFLEX MICROSCOPIC (NOT AT [facility]) - Abnormal; Notable for the following:    Specific Gravity, Urine 1.034 (*)    Hgb urine dipstick TRACE (*)    All other components within normal limits  URINE MICROSCOPIC-ADD ON - Abnormal; Notable for the following:    Squamous Epithelial / LPF 6-30 (*)    Bacteria, UA FEW (*)    All other components within normal limits  LIPASE, BLOOD  CBC  POC URINE PREG, ED    Imaging Review US Transvaginal Non-ob  01/27/2016  CLINICAL DATA:  Lower abdominal and pelvic pain for 4 days with nausea. EXAM: TRANSABDOMINAL AND TRANSVAGINAL ULTRASOUND OF PELVIS DOPPLER ULTRASOUND OF OVARIES TECHNIQUE: Both transabdominal and transvaginal ultrasound examinations of the pelvis were performed. Transabdominal technique was performed for global imaging of the pelvis including uterus, ovaries, adnexal regions, and pelvic cul-de-sac. It was necessary to proceed with endovaginal exam following the transabdominal exam to visualize the endometrium and ovaries. Color and duplex Doppler ultrasound was utilized to evaluate blood flow to the ovaries. COMPARISON:  None. FINDINGS: Uterus Measurements: 7.2 x 4.0 x 4.2 cm. No fibroids or other mass visualized. Nabothian cysts are incidentally noted. Endometrium Thickness: 0.5 cm.  No focal abnormality visualized. Right ovary Measurements: 2.7 x 1.4 x 2.3 cm. Normal appearance/no adnexal mass. Left ovary Measurements: 2.7 x 3.3 x 3.0 cm. Normal appearance/no adnexal mass. Pulsed Doppler evaluation of both ovaries demonstrates normal low-resistance arterial and venous waveforms. Other findings Small  volume of free pelvic fluid is noted. IMPRESSION: Negative exam. Electronically Signed   By: Drusilla Kanner M.D.   On: 01/27/2016 17:30   US Pelvis Complete  01/27/2016  CLINICAL DATA:  Lower abdominal and pelvic pain for 4 days with nausea. EXAM: TRANSABDOMINAL AND TRANSVAGINAL ULTRASOUND OF PELVIS DOPPLER ULTRASOUND OF OVARIES TECHNIQUE: Both transabdominal and transvaginal ultrasound examinations of the pelvis were performed. Transabdominal technique was performed for global imaging of the pelvis including uterus, ovaries, adnexal regions, and pelvic cul-de-sac. It was necessary to proceed with endovaginal exam following the transabdominal exam to visualize the endometrium and ovaries. Color and duplex Doppler ultrasound was utilized to evaluate blood flow to the ovaries. COMPARISON:  None. FINDINGS: Uterus Measurements: 7.2 x 4.0 x 4.2 cm. No fibroids or other mass visualized. Nabothian cysts are incidentally noted. Endometrium Thickness: 0.5 cm.  No focal abnormality visualized. Right ovary Measurements: 2.7 x 1.4 x 2.3 cm. Normal appearance/no adnexal mass. Left ovary Measurements: 2.7 x 3.3 x 3.0 cm. Normal appearance/no adnexal mass. Pulsed Doppler evaluation of both ovaries demonstrates normal low-resistance arterial and venous waveforms. Other findings Small volume of free pelvic fluid is noted. IMPRESSION: Negative exam. Electronically Signed   By: Drusilla Kanner M.D.   On: 01/27/2016 17:30   Ct Abdomen Pelvis W Contrast  01/27/2016  CLINICAL  DATA:  Right lower quadrant abdominal pain for 4 days with nausea. EXAM: CT ABDOMEN AND PELVIS WITH CONTRAST TECHNIQUE: Multidetector CT imaging of the abdomen and pelvis was performed using the standard protocol following bolus administration of intravenous contrast. CONTRAST:  ISOVUE-300 IOPAMIDOL (ISOVUE-300) INJECTION 61% COMPARISON:  CT, 06/27/2008 FINDINGS: Lung bases:  Clear.  Heart normal in size. Hepatobiliary: There are 3, sub cm hypo  attenuating liver lesions that are nonspecific but likely benign possibly cysts. These are not appreciated on the prior study. No other liver abnormality. Gallbladder is mostly decompressed but otherwise unremarkable. No bile duct dilation. Spleen, pancreas, adrenal glands:  Normal. Kidneys, ureters, bladder:  Normal. Uterus and adnexa:  Unremarkable. Lymph nodes:  No adenopathy. Ascites:  None. Gastrointestinal: Normal appendix visualized. Stomach, small bowel and colon are unremarkable. Musculoskeletal:  Normal. IMPRESSION: 1. No acute findings. No findings to explain this patient's symptoms. Normal appendix visualized. 2. Three sub cm low-density lesions in the liver, nonspecific. These are most likely small cysts. Consider followup evaluation with liver ultrasound on a non emergent/nonurgent basis. 3. No other abnormalities. Electronically Signed   By: Amie Portland M.D.   On: 01/27/2016 18:36   [place] Art/ven Flow Abd Pelv Doppler  01/27/2016  CLINICAL DATA:  Lower abdominal and pelvic pain for 4 days with nausea. EXAM: TRANSABDOMINAL AND TRANSVAGINAL ULTRASOUND OF PELVIS DOPPLER ULTRASOUND OF OVARIES TECHNIQUE: Both transabdominal and transvaginal ultrasound examinations of the pelvis were performed. Transabdominal technique was performed for global imaging of the pelvis including uterus, ovaries, adnexal regions, and pelvic cul-de-sac. It was necessary to proceed with endovaginal exam following the transabdominal exam to visualize the endometrium and ovaries. Color and duplex Doppler ultrasound was utilized to evaluate blood flow to the ovaries. COMPARISON:  None. FINDINGS: Uterus Measurements: 7.2 x 4.0 x 4.2 cm. No fibroids or other mass visualized. Nabothian cysts are incidentally noted. Endometrium Thickness: 0.5 cm.  No focal abnormality visualized. Right ovary Measurements: 2.7 x 1.4 x 2.3 cm. Normal appearance/no adnexal mass. Left ovary Measurements: 2.7 x 3.3 x 3.0 cm. Normal appearance/no adnexal  mass. Pulsed Doppler evaluation of both ovaries demonstrates normal low-resistance arterial and venous waveforms. Other findings Small volume of free pelvic fluid is noted. IMPRESSION: Negative exam. Electronically Signed   By: Drusilla Kanner M.D.   On: 01/27/2016 17:30    I have personally reviewed and evaluated these images and lab results as part of my medical decision-making.   EKG Interpretation None      MDM   Final diagnoses:  PID (acute pelvic inflammatory disease)    30 year old female with a history of positive gonorrhea test following ED evaluation at [facility] on 01/27/2016 presents for persistent lower abdominal cramping. She has continued to have vaginal discharge. Patient is afebrile and without leukocytosis. She has no evidence of acute surgical abdomen on exam. Results of the patient's pelvic ultrasound and CT scan performed at [facility] has been reviewed; negative for acute findings. Abdominal pain in the setting of STDs is consistent with a diagnosis of pelvic inflammatory disease. Patient given dose of Rocephin in the ED. Will discharge with doxycycline. No evidence of trichomonal urethritis. Will hold Flagyl at this time. Patient advised to follow-up with the health department as needed. She is to return for worsening symptoms. Return precautions discussed and provided. Patient discharged in satisfactory condition with no unaddressed concerns.   Filed Vitals:   02/01/16 2049  BP: 136/92  Pulse: 81  Temp: 98.4 F (36.9 C)  TempSrc: Oral  Resp: 18  SpO2: 100%     Antony MaduraKelly Denese Mentink, PA-C 02/01/16 2246  Arby BarretteMarcy Pfeiffer, MD 02/01/16 2326

## 2017-03-19 IMAGING — CT CT ABD-PELV W/ CM
2 of 4 series · 16 of 46 positions shown, 18 images · IV contrast (iopamidol)
Comparison: CT, 06/27/2008

CLINICAL DATA: Right lower quadrant abdominal pain for 4 days with
nausea.

EXAM:
CT ABDOMEN AND PELVIS WITH CONTRAST
TECHNIQUE: Multidetector CT imaging of the abdomen and pelvis was performed
using the standard protocol following bolus administration of
intravenous contrast.
CONTRAST:  100mL [id number] IOPAMIDOL (T97OTF-X66) INJECTION 61%

[Series 2: routine abd pel with · axial · 0.76mm/px · z∈[-500,-40]mm · 13 of 102 slices shown, 15 images]
[im 5/102  soft-tissue]
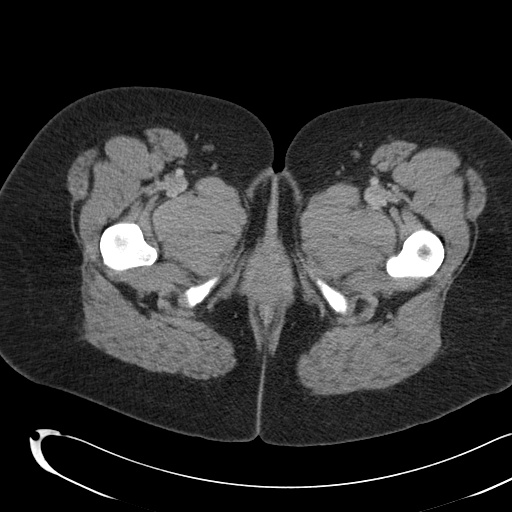
[im 5/102  bone]
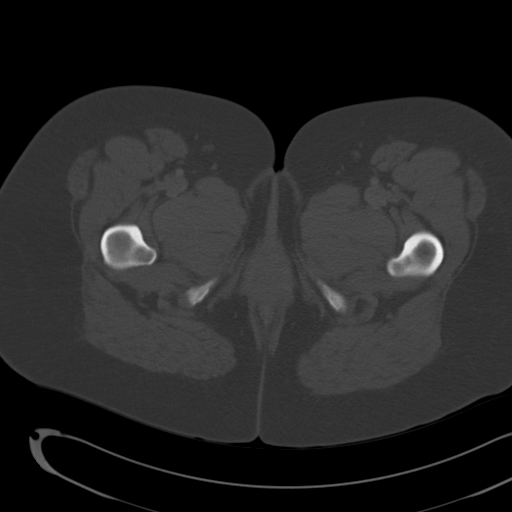
[im 13/102  soft-tissue]
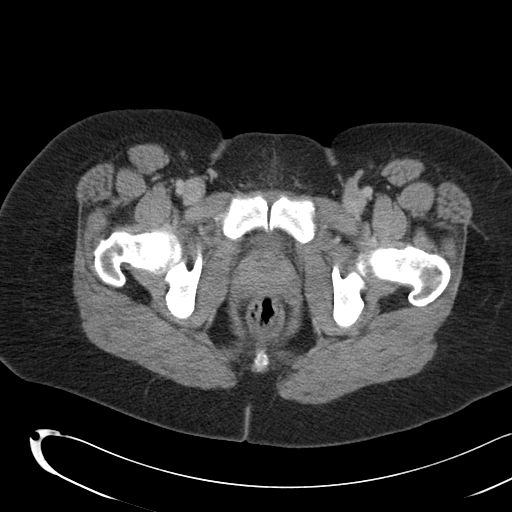
[im 22/102  soft-tissue]
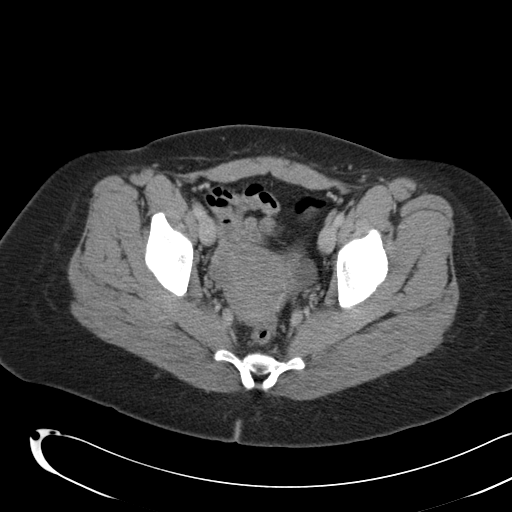
[im 30/102  soft-tissue]
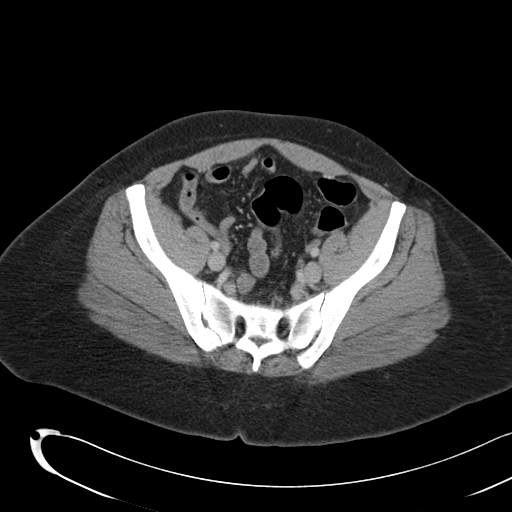
[im 34/102  soft-tissue]
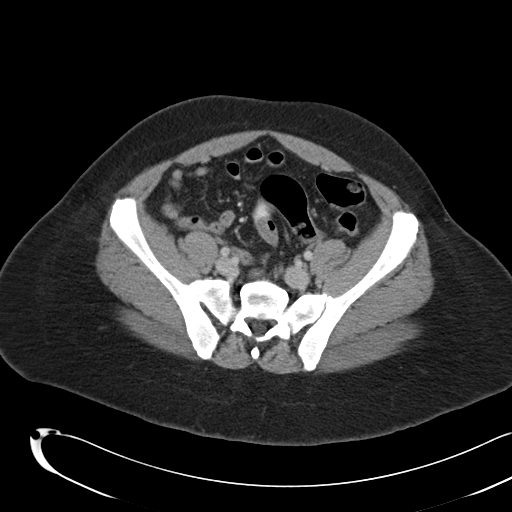
[im 43/102  soft-tissue]
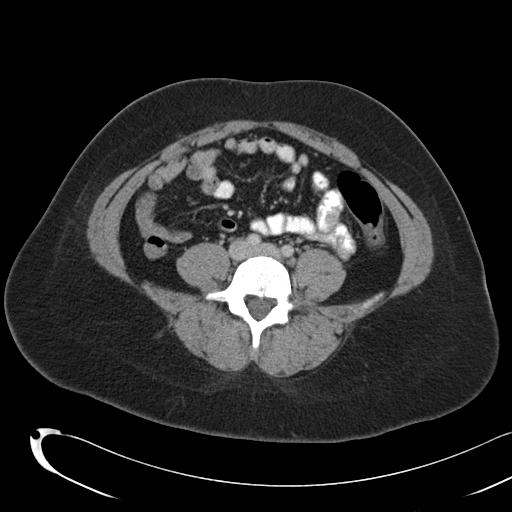
[im 51/102  soft-tissue]
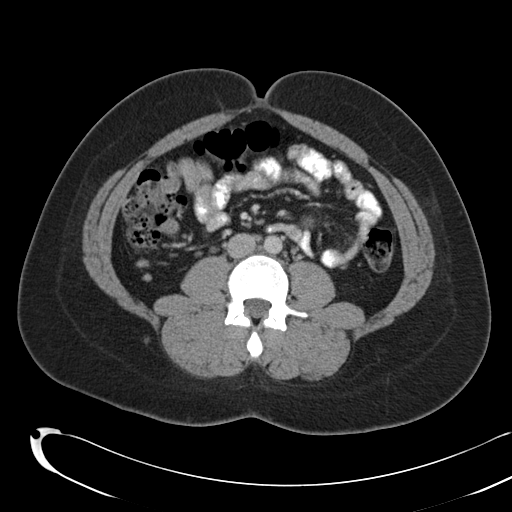
[im 59/102  soft-tissue]
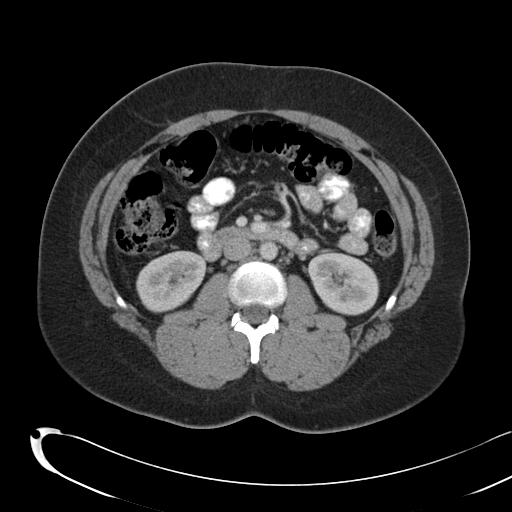
[im 68/102  soft-tissue]
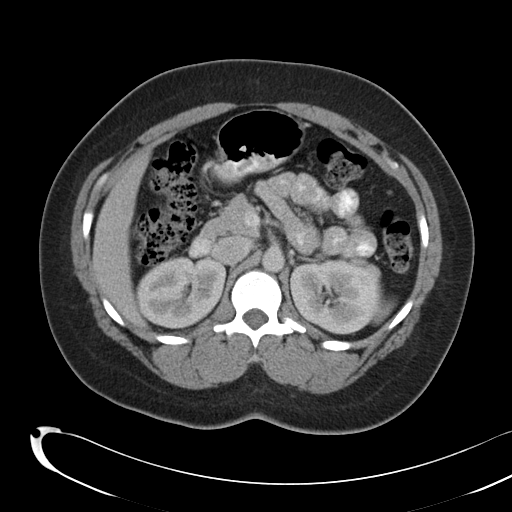
[im 68/102  bone]
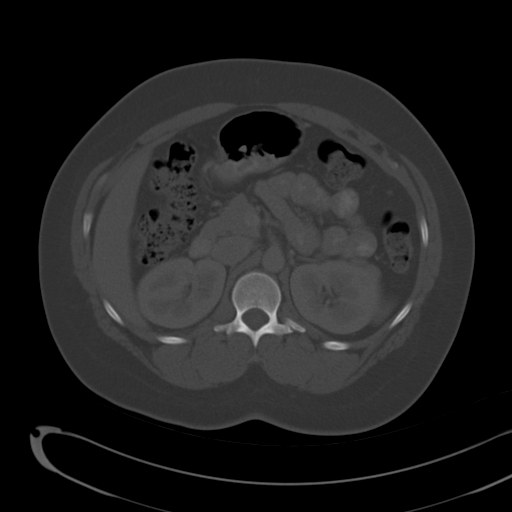
[im 72/102  soft-tissue]
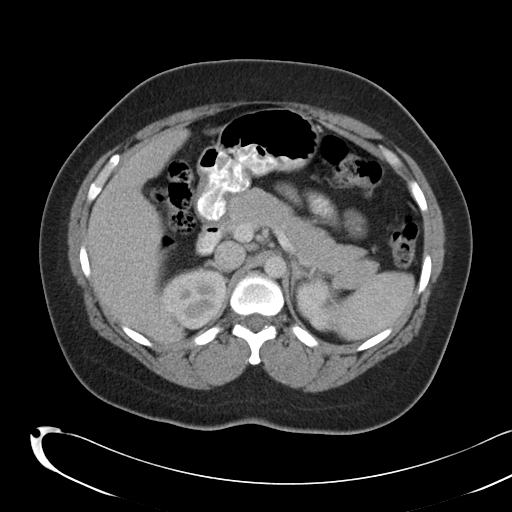
[im 80/102  soft-tissue]
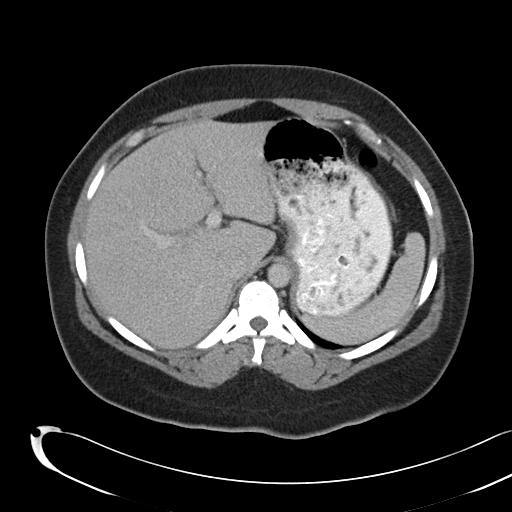
[im 89/102  soft-tissue]
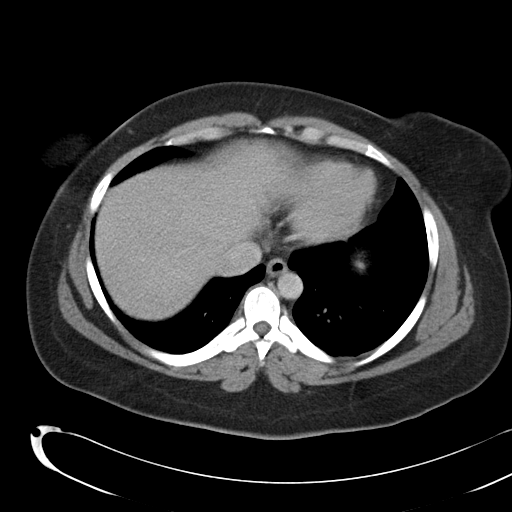
[im 97/102  soft-tissue]
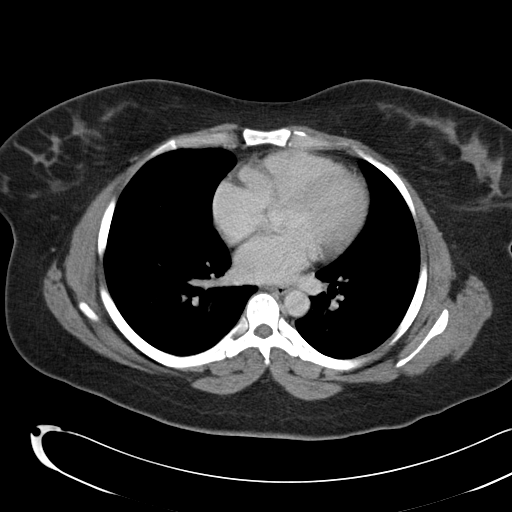

[Series 5: cor routine abd pel with · coronal · 0.73mm/px · 3 of 123 slices shown]
[im 41/123  soft-tissue]
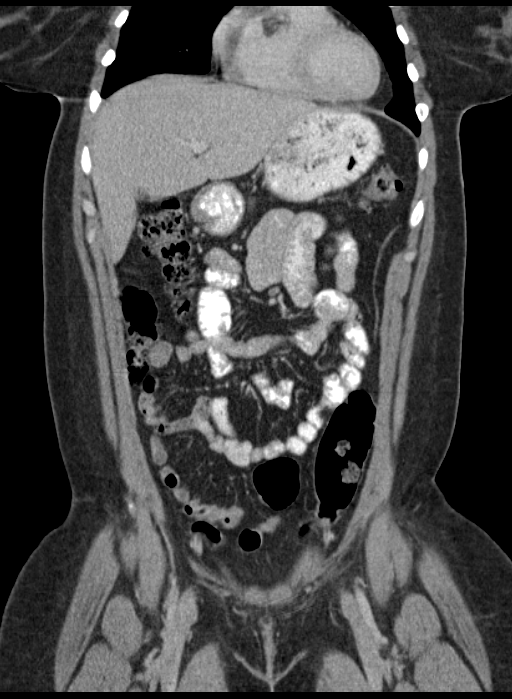
[im 55/123  soft-tissue]
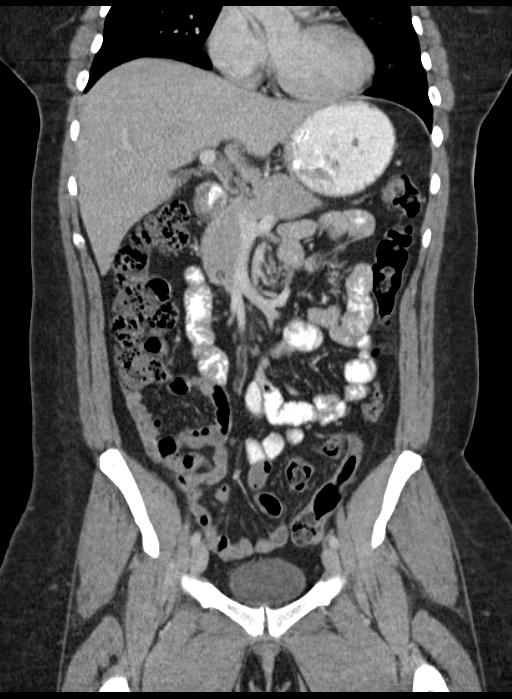
[im 68/123  soft-tissue]
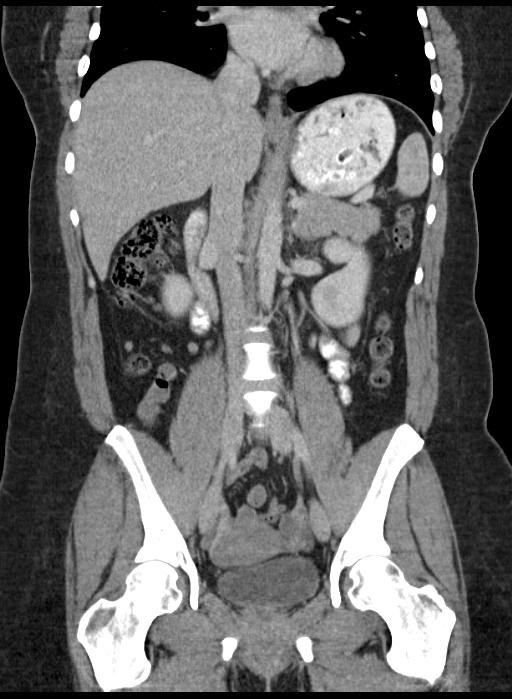

[16 of 46 positions shown; findings below may reference images not displayed]

FINDINGS: Lung bases:  Clear.  Heart normal in size.

Hepatobiliary: There are 3, sub cm hypo attenuating liver lesions
that are nonspecific but likely benign possibly cysts. These are not
appreciated on the prior study. No other liver abnormality.
Gallbladder is mostly decompressed but otherwise unremarkable. No
bile duct dilation.

Spleen, pancreas, adrenal glands:  Normal.

Kidneys, ureters, bladder:  Normal.

Uterus and adnexa:  Unremarkable.

Lymph nodes:  No adenopathy.

Ascites:  None.

Gastrointestinal: Normal appendix visualized. Stomach, small bowel
and colon are unremarkable.

Musculoskeletal:  Normal.
IMPRESSION: 1. No acute findings. No findings to explain this patient's
symptoms. Normal appendix visualized.
2. Three sub cm low-density lesions in the liver, nonspecific. These
are most likely small cysts. Consider followup evaluation with liver
ultrasound on a non emergent/nonurgent basis.
3. No other abnormalities.
